# Patient Record
Sex: Female | Born: 1985 | Race: White | Hispanic: No | Marital: Single | Smoking: Light tobacco smoker
Health system: Southern US, Community
[De-identification: ages and names within clinical notes are randomized; demographics above are authoritative.]

---

## 2018-12-08 ENCOUNTER — Other Ambulatory Visit: Payer: Self-pay

## 2018-12-09 ENCOUNTER — Ambulatory Visit: Payer: Medicaid Other | Admitting: Family Medicine

## 2018-12-09 ENCOUNTER — Other Ambulatory Visit: Payer: Self-pay

## 2018-12-09 ENCOUNTER — Encounter: Payer: Self-pay | Admitting: Family Medicine

## 2018-12-09 VITALS — BP 113/73 | HR 92 | Temp 97.8°F | Ht 69.0 in | Wt 189.2 lb

## 2018-12-09 DIAGNOSIS — Z0000 Encounter for general adult medical examination without abnormal findings: Secondary | ICD-10-CM

## 2018-12-09 DIAGNOSIS — N644 Mastodynia: Secondary | ICD-10-CM

## 2018-12-09 DIAGNOSIS — Z0001 Encounter for general adult medical examination with abnormal findings: Secondary | ICD-10-CM

## 2018-12-09 DIAGNOSIS — Z23 Encounter for immunization: Secondary | ICD-10-CM | POA: Diagnosis not present

## 2018-12-09 NOTE — Patient Instructions (Signed)
Preventive Care 21-35 Years Old, Female Preventive care refers to visits with your health care provider and lifestyle choices that can promote health and wellness. This includes:  A yearly physical exam. This may also be called an annual well check.  Regular dental visits and eye exams.  Immunizations.  Screening for certain conditions.  Healthy lifestyle choices, such as eating a healthy diet, getting regular exercise, not using drugs or products that contain nicotine and tobacco, and limiting alcohol use. What can I expect for my preventive care visit? Physical exam Your health care provider will check your:  Height and weight. This may be used to calculate body mass index (BMI), which tells if you are at a healthy weight.  Heart rate and blood pressure.  Skin for abnormal spots. Counseling Your health care provider may ask you questions about your:  Alcohol, tobacco, and drug use.  Emotional well-being.  Home and relationship well-being.  Sexual activity.  Eating habits.  Work and work environment.  Method of birth control.  Menstrual cycle.  Pregnancy history. What immunizations do I need?  Influenza (flu) vaccine  This is recommended every year. Tetanus, diphtheria, and pertussis (Tdap) vaccine  You may need a Td booster every 10 years. Varicella (chickenpox) vaccine  You may need this if you have not been vaccinated. Human papillomavirus (HPV) vaccine  If recommended by your health care provider, you may need three doses over 6 months. Measles, mumps, and rubella (MMR) vaccine  You may need at least one dose of MMR. You may also need a second dose. Meningococcal conjugate (MenACWY) vaccine  One dose is recommended if you are age 19-21 years and a first-year college student living in a residence hall, or if you have one of several medical conditions. You may also need additional booster doses. Pneumococcal conjugate (PCV13) vaccine  You may need  this if you have certain conditions and were not previously vaccinated. Pneumococcal polysaccharide (PPSV23) vaccine  You may need one or two doses if you smoke cigarettes or if you have certain conditions. Hepatitis A vaccine  You may need this if you have certain conditions or if you travel or work in places where you may be exposed to hepatitis A. Hepatitis B vaccine  You may need this if you have certain conditions or if you travel or work in places where you may be exposed to hepatitis B. Haemophilus influenzae type b (Hib) vaccine  You may need this if you have certain conditions. You may receive vaccines as individual doses or as more than one vaccine together in one shot (combination vaccines). Talk with your health care provider about the risks and benefits of combination vaccines. What tests do I need?  Blood tests  Lipid and cholesterol levels. These may be checked every 5 years starting at age 20.  Hepatitis C test.  Hepatitis B test. Screening  Diabetes screening. This is done by checking your blood sugar (glucose) after you have not eaten for a while (fasting).  Sexually transmitted disease (STD) testing.  BRCA-related cancer screening. This may be done if you have a family history of breast, ovarian, tubal, or peritoneal cancers.  Pelvic exam and Pap test. This may be done every 3 years starting at age 21. Starting at age 30, this may be done every 5 years if you have a Pap test in combination with an HPV test. Talk with your health care provider about your test results, treatment options, and if necessary, the need for more tests.   Follow these instructions at home: Eating and drinking   Eat a diet that includes fresh fruits and vegetables, whole grains, lean protein, and low-fat dairy.  Take vitamin and mineral supplements as recommended by your health care provider.  Do not drink alcohol if: ? Your health care provider tells you not to drink. ? You are  pregnant, may be pregnant, or are planning to become pregnant.  If you drink alcohol: ? Limit how much you have to 0-1 drink a day. ? Be aware of how much alcohol is in your drink. In the [place] one drink equals one 12 oz bottle of beer (355 mL), one 5 oz glass of wine (148 mL), or one 1 oz glass of hard liquor (44 mL). Lifestyle  Take daily care of your teeth and gums.  Stay active. Exercise for at least 30 minutes on 5 or more days each week.  Do not use any products that contain nicotine or tobacco, such as cigarettes, e-cigarettes, and chewing tobacco. If you need help quitting, ask your health care provider.  If you are sexually active, practice safe sex. Use a condom or other form of birth control (contraception) in order to prevent pregnancy and STIs (sexually transmitted infections). If you plan to become pregnant, see your health care provider for a preconception visit. What's next?  Visit your health care provider once a year for a well check visit.  Ask your health care provider how often you should have your eyes and teeth checked.  Stay up to date on all vaccines. This information is not intended to replace advice given to you by your health care provider. Make sure you discuss any questions you have with your health care provider. Document Released: 03/06/2001 Document Revised: 09/19/2017 Document Reviewed: 09/19/2017 Elsevier Patient Education  2020 [employer]  

## 2018-12-09 NOTE — Progress Notes (Signed)
New Patient Office Visit  Assessment & Plan:  1. Well adult exam - Preventive care education provided. Pap smear record requested which patient reported was 2 years ago. UTD with TDAP. Declined HIV screening. Flu given today.  - CBC with Differential/Platelet - CMP14+EGFR - Lipid Panel  2. Pain of left breast - MM Digital Diagnostic Unilat L; Future - ordered with ultrasound if needed.  3. Need for immunization against influenza - Flu Vaccine QUAD 36+ mos IM   Follow-up: Return in about 1 year (around 12/09/2019) for annual physical.   Hendricks Limes, MSN, APRN, FNP-C [facility]  Subjective:  Patient ID: Latasha Horton, female    DOB: September 14, 1985  Age: 33 y.o. MRN: [id number]  Patient Care Team: Loman Brooklyn, FNP as PCP - General (Family Medicine)  CC:  Chief Complaint  Patient presents with  . New Patient (Initial Visit)  . Establish Care  . Breast Pain    x 2 weeks- left    HPI Latasha Horton presents to establish care. She does not have a former PCP from which she is transferring care. She was going to the health department in [facility] for pap smears.  Patient reports a lump in her left breast that she noticed ~3 weeks ago. It was tender for two weeks and then started feeling better last week. She does not drink excessive amounts of caffeine but does have one cup of coffee per day. She has an IUD and is therefore not having periods. She has never had a mammogram. No family history of breast cancer. She has lost a lot of weight over the past couple of years being on the keto and then low carb diet. She has lost from 277 lbs down to 189 lbs today (88 lbs). She wonders if she can just feel more since her breasts are no longer as big. She denies any nipple drainage.    Review of Systems  Constitutional: Negative for chills, fever, malaise/fatigue and weight loss.  HENT: Negative for congestion, ear discharge, ear pain, nosebleeds, sinus pain,  sore throat and tinnitus.   Eyes: Negative for blurred vision, double vision, pain, discharge and redness.  Respiratory: Negative for cough, shortness of breath and wheezing.   Cardiovascular: Negative for chest pain, palpitations and leg swelling.  Gastrointestinal: Negative for abdominal pain, constipation, diarrhea, heartburn, nausea and vomiting.  Genitourinary: Negative for dysuria, frequency and urgency.  Musculoskeletal: Negative for myalgias.  Skin: Negative for rash.  Neurological: Negative for dizziness, seizures, weakness and headaches.  Psychiatric/Behavioral: Negative for depression, substance abuse and suicidal ideas. The patient is not nervous/anxious.    No current outpatient medications on file.  No Known Allergies  History reviewed. No pertinent past medical history.  Past Surgical History:  Procedure Laterality Date  . CESAREAN SECTION      Family History  Problem Relation Age of Onset  . Diabetes Father     Social History   Socioeconomic History  . Marital status: Single    Spouse name: Not on file  . Number of children: Not on file  . Years of education: Not on file  . Highest education level: Not on file  Occupational History  . Not on file  Social Needs  . Financial resource strain: Not on file  . Food insecurity    Worry: Not on file    Inability: Not on file  . Transportation needs    Medical: Not on file    Non-medical: Not on  file  Tobacco Use  . Smoking status: Light Tobacco Smoker    Types: Cigarettes  . Smokeless tobacco: Never Used  Substance and Sexual Activity  . Alcohol use: Yes    Comment: occ  . Drug use: Never  . Sexual activity: Not on file  Lifestyle  . Physical activity    Days per week: Not on file    Minutes per session: Not on file  . Stress: Not on file  Relationships  . Social Herbalist on phone: Not on file    Gets together: Not on file    Attends religious service: Not on file    Active member of  club or organization: Not on file    Attends meetings of clubs or organizations: Not on file    Relationship status: Not on file  . Intimate partner violence    Fear of current or ex partner: Not on file    Emotionally abused: Not on file    Physically abused: Not on file    Forced sexual activity: Not on file  Other Topics Concern  . Not on file  Social History Narrative  . Not on file    Objective:   Today's Vitals: BP 113/73   Pulse 92   Temp 97.8 F (36.6 C) (Temporal)   Ht '5\' 9"'  (1.753 m)   Wt 189 lb 3.2 oz (85.8 kg)   LMP 12/02/2018   SpO2 100%   BMI 27.94 kg/m   Physical Exam Vitals signs reviewed. Exam conducted with a chaperone present.  Constitutional:      General: She is not in acute distress.    Appearance: Normal appearance. She is not ill-appearing, toxic-appearing or diaphoretic.  HENT:     Head: Normocephalic and atraumatic.     Right Ear: Tympanic membrane, ear canal and external ear normal. There is no impacted cerumen.     Left Ear: Tympanic membrane, ear canal and external ear normal. There is no impacted cerumen.     Nose: Nose normal. No congestion or rhinorrhea.     Mouth/Throat:     Mouth: Mucous membranes are moist.     Pharynx: Oropharynx is clear. No oropharyngeal exudate or posterior oropharyngeal erythema.  Eyes:     General: No scleral icterus.       Right eye: No discharge.        Left eye: No discharge.     Conjunctiva/sclera: Conjunctivae normal.     Pupils: Pupils are equal, round, and reactive to light.  Neck:     Musculoskeletal: Normal range of motion and neck supple. No neck rigidity or muscular tenderness.  Cardiovascular:     Rate and Rhythm: Normal rate and regular rhythm.     Heart sounds: Normal heart sounds. No murmur. No friction rub. No gallop.   Pulmonary:     Effort: Pulmonary effort is normal. No respiratory distress.     Breath sounds: Normal breath sounds. No stridor. No wheezing, rhonchi or rales.  Chest:      Breasts:        Right: No swelling, bleeding, inverted nipple, mass, nipple discharge, skin change or tenderness.        Left: Tenderness (under left nipple) present. No swelling, bleeding, inverted nipple, mass, nipple discharge or skin change.  Abdominal:     General: Abdomen is flat.  Musculoskeletal: Normal range of motion.  Lymphadenopathy:     Cervical: No cervical adenopathy.     Upper Body:  Right upper body: No supraclavicular, axillary or pectoral adenopathy.     Left upper body: No supraclavicular, axillary or pectoral adenopathy.  Skin:    General: Skin is warm and dry.     Capillary Refill: Capillary refill takes less than 2 seconds.  Neurological:     General: No focal deficit present.     Mental Status: She is alert and oriented to person, place, and time. Mental status is at baseline.  Psychiatric:        Mood and Affect: Mood normal.        Behavior: Behavior normal.        Thought Content: Thought content normal.        Judgment: Judgment normal.

## 2018-12-10 LAB — CBC WITH DIFFERENTIAL/PLATELET
Basophils Absolute: 0 10*3/uL (ref 0.0–0.2)
Basos: 0 %
EOS (ABSOLUTE): 0.2 10*3/uL (ref 0.0–0.4)
Eos: 2 %
Hematocrit: 36.7 % (ref 34.0–46.6)
Hemoglobin: 12.3 g/dL (ref 11.1–15.9)
Immature Grans (Abs): 0 10*3/uL (ref 0.0–0.1)
Immature Granulocytes: 0 %
Lymphocytes Absolute: 2.4 10*3/uL (ref 0.7–3.1)
Lymphs: 34 %
MCH: 31.8 pg (ref 26.6–33.0)
MCHC: 33.5 g/dL (ref 31.5–35.7)
MCV: 95 fL (ref 79–97)
Monocytes Absolute: 0.6 10*3/uL (ref 0.1–0.9)
Monocytes: 9 %
Neutrophils Absolute: 3.8 10*3/uL (ref 1.4–7.0)
Neutrophils: 55 %
Platelets: 147 10*3/uL — ABNORMAL LOW (ref 150–450)
RBC: 3.87 x10E6/uL (ref 3.77–5.28)
RDW: 12.4 % (ref 11.7–15.4)
WBC: 7 10*3/uL (ref 3.4–10.8)

## 2018-12-10 LAB — CMP14+EGFR
ALT: 18 IU/L (ref 0–32)
AST: 20 IU/L (ref 0–40)
Albumin/Globulin Ratio: 1.7 (ref 1.2–2.2)
Albumin: 4.4 g/dL (ref 3.8–4.8)
Alkaline Phosphatase: 35 IU/L — ABNORMAL LOW (ref 39–117)
BUN/Creatinine Ratio: 10 (ref 9–23)
BUN: 6 mg/dL (ref 6–20)
Bilirubin Total: 0.4 mg/dL (ref 0.0–1.2)
CO2: 23 mmol/L (ref 20–29)
Calcium: 9.1 mg/dL (ref 8.7–10.2)
Chloride: 101 mmol/L (ref 96–106)
Creatinine, Ser: 0.63 mg/dL (ref 0.57–1.00)
GFR calc Af Amer: 134 mL/min/{1.73_m2} (ref >59)
GFR calc non Af Amer: 117 mL/min/{1.73_m2} (ref >59)
Globulin, Total: 2.6 g/dL (ref 1.5–4.5)
Glucose: 79 mg/dL (ref 65–99)
Potassium: 3.9 mmol/L (ref 3.5–5.2)
Sodium: 137 mmol/L (ref 134–144)
Total Protein: 7 g/dL (ref 6.0–8.5)

## 2018-12-10 LAB — LIPID PANEL
Chol/HDL Ratio: 2.8 ratio (ref 0.0–4.4)
Cholesterol, Total: 161 mg/dL (ref 100–199)
HDL: 58 mg/dL (ref >39)
LDL Chol Calc (NIH): 89 mg/dL (ref 0–99)
Triglycerides: 74 mg/dL (ref 0–149)
VLDL Cholesterol Cal: 14 mg/dL (ref 5–40)

## 2018-12-11 ENCOUNTER — Encounter: Payer: Self-pay | Admitting: Family Medicine

## 2019-02-03 ENCOUNTER — Telehealth: Payer: Self-pay

## 2019-02-03 ENCOUNTER — Other Ambulatory Visit: Payer: Self-pay

## 2019-02-03 DIAGNOSIS — N644 Mastodynia: Secondary | ICD-10-CM

## 2019-02-03 NOTE — Telephone Encounter (Signed)
Called patient in regards to appt for breast imaging  - LMTCB

## 2019-02-04 ENCOUNTER — Other Ambulatory Visit: Payer: Self-pay | Admitting: Family Medicine

## 2019-02-04 ENCOUNTER — Telehealth: Payer: Self-pay | Admitting: Family Medicine

## 2019-02-04 DIAGNOSIS — N644 Mastodynia: Secondary | ICD-10-CM

## 2019-02-05 NOTE — Telephone Encounter (Signed)
Called patient and notified her of appointment for breast imaging

## 2019-02-17 ENCOUNTER — Other Ambulatory Visit: Payer: Self-pay

## 2019-02-17 ENCOUNTER — Ambulatory Visit (HOSPITAL_COMMUNITY)
Admission: RE | Admit: 2019-02-17 | Discharge: 2019-02-17 | Disposition: A | Discharge status: Home / Self Care | Payer: BLUE CROSS/BLUE SHIELD | Source: Ambulatory Visit | Attending: Family Medicine | Admitting: Family Medicine

## 2019-02-17 ENCOUNTER — Ambulatory Visit (HOSPITAL_COMMUNITY): Payer: BLUE CROSS/BLUE SHIELD

## 2019-02-17 ENCOUNTER — Inpatient Hospital Stay (HOSPITAL_COMMUNITY)
Admission: RE | Admit: 2019-02-17 | Discharge: 2019-02-17 | Disposition: A | Discharge status: Home / Self Care | Payer: BLUE CROSS/BLUE SHIELD | Source: Ambulatory Visit | Attending: Family Medicine | Admitting: Family Medicine

## 2019-02-17 DIAGNOSIS — N644 Mastodynia: Secondary | ICD-10-CM

## 2020-02-02 IMAGING — US US BREAST*L* LIMITED INC AXILLA
1 series · 8 of 8 positions shown · non-contrast
Comparison: None.

CLINICAL DATA: 33-year-old female with a palpable area of concern
in the left breast which the patient feels may be smaller. The
patient has had an intentional 100 pound weight loss over the past 2
years.

EXAM:
DIGITAL DIAGNOSTIC BILATERAL MAMMOGRAM WITH CAD AND TOMO
LEFT BREAST ULTRASOUND

[Series 1: us breast*left* limited inc axilla · 0.07mm/px · 8 of 8 slices shown]
[im 1/8]
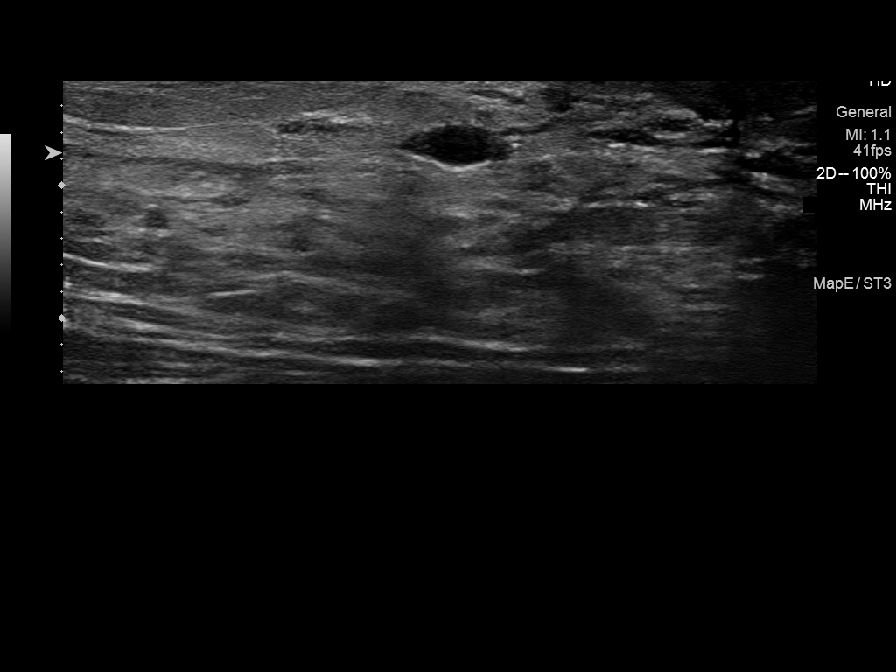
[im 2/8]
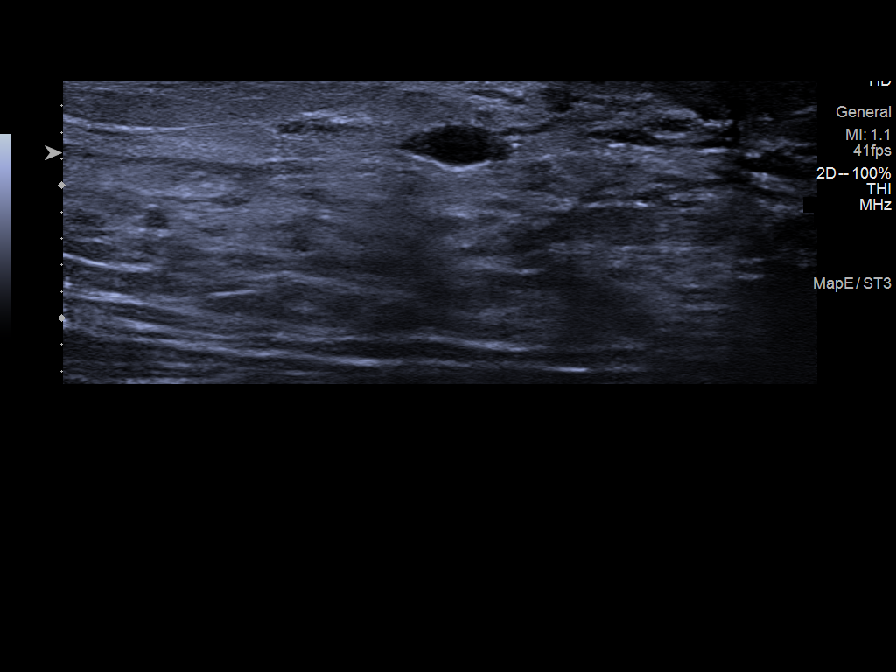
[im 3/8]
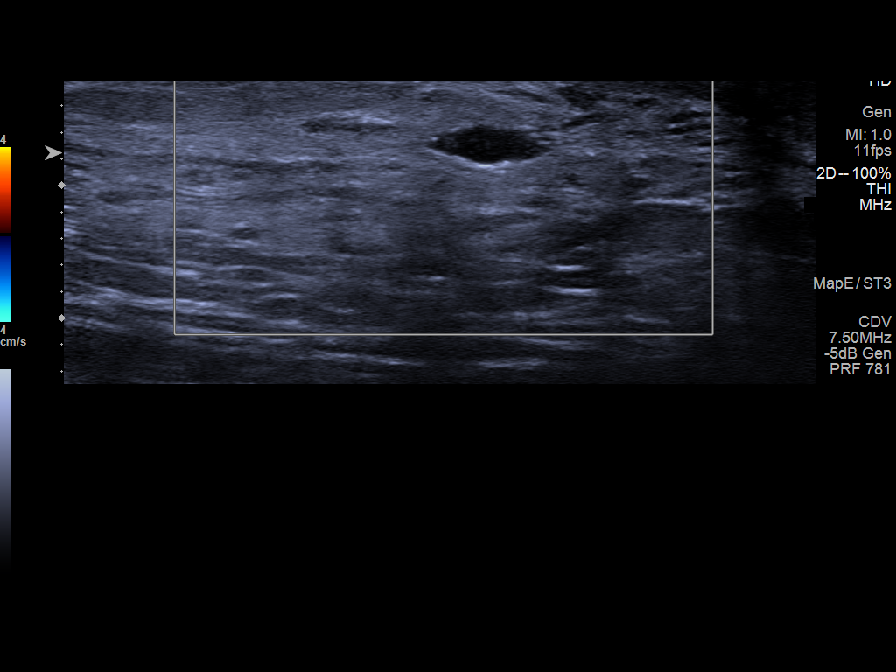
[im 4/8]
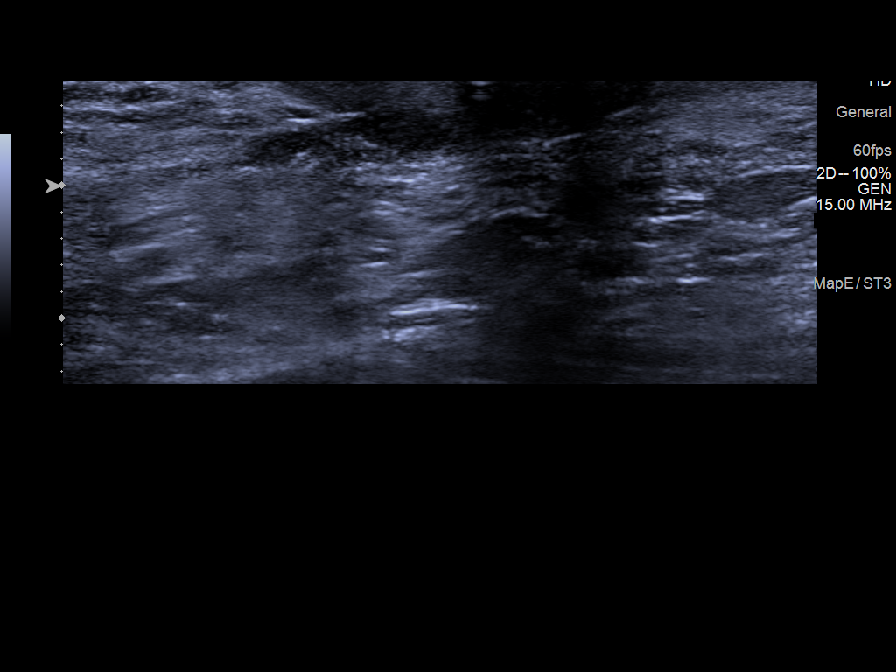
[im 5/8]
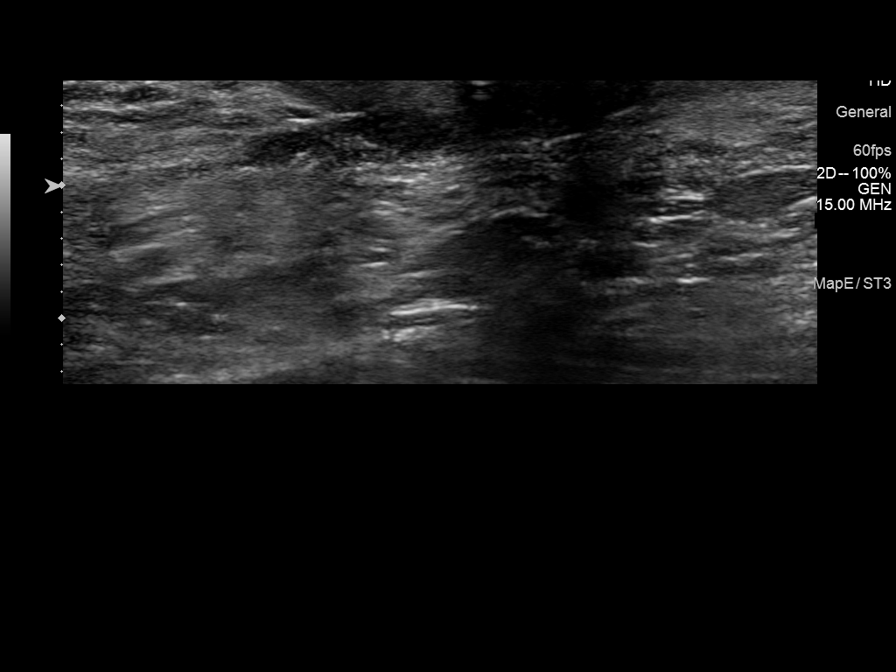
[im 6/8]
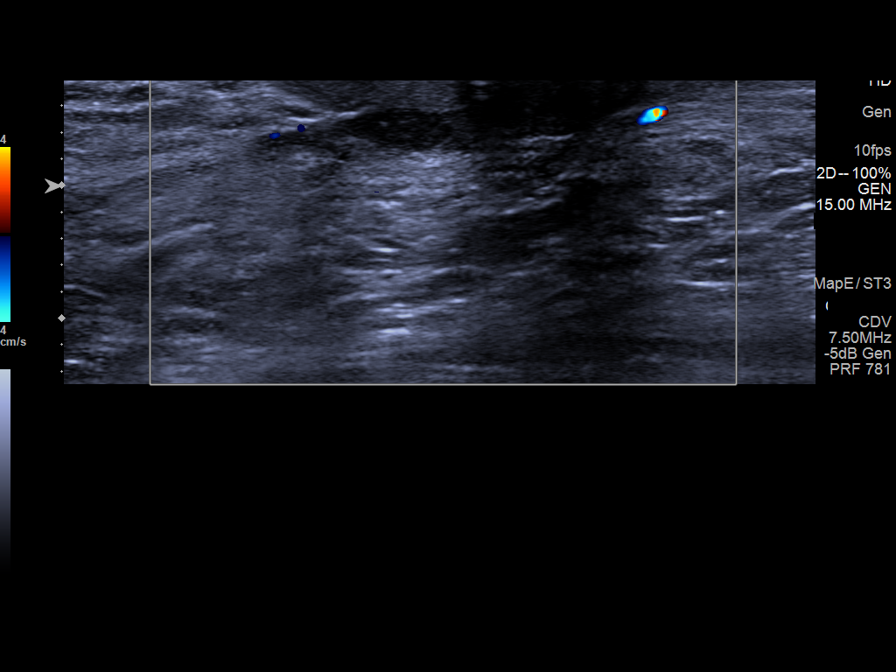
[im 7/8]
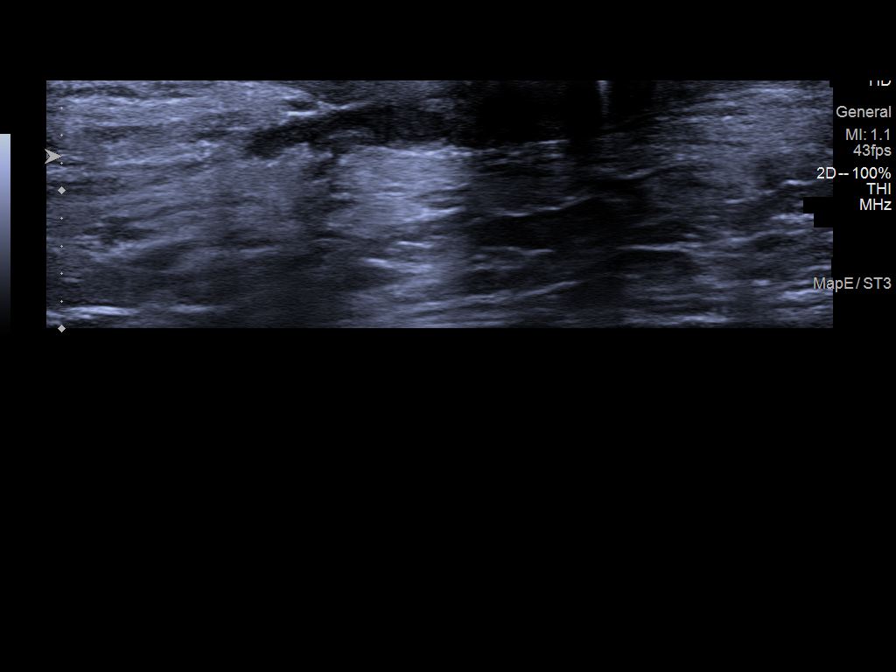
[im 8/8]
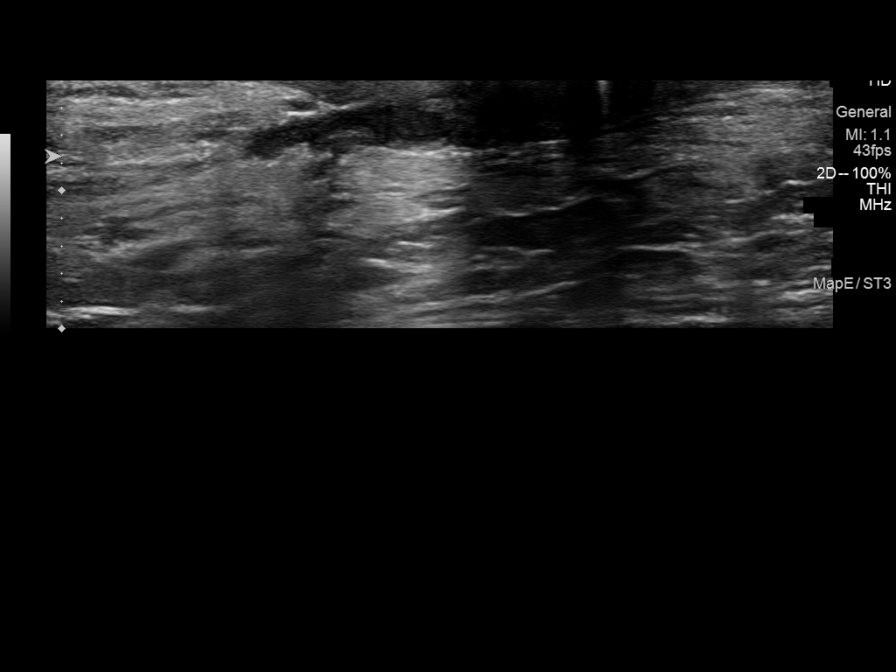

[8 of 8 positions shown; findings below may reference images not displayed]

ACR Breast Density Category c: The breast tissue is heterogeneously
dense, which may obscure small masses.
FINDINGS: No suspicious masses or calcifications are seen in the right breast.
Spot compression cc tomograms were performed over the palpable area
of concern in the slightly inner left breast with only dense
fibroglandular tissue seen.

Mammographic images were processed with CAD.

Physical examination of the periareolar/slightly upper inner left
breast in the region of palpable concern reveals an area of mobile
nodularity.

Targeted ultrasound of the left breast was performed. No suspicious
masses or abnormality seen, only scattered areas of fibrocystic
change with several benign-appearing slightly dilated ducts in the
region of concern. These slightly dilated ducts are felt to
correspond well with the patient's palpable abnormality. No
suspicious masses or abnormality seen.
IMPRESSION: 1.  No findings of malignancy in either breast.

2. No suspicious mammographic or sonographic findings seen in the
region of palpable concern in the left breast, with only mild
benign-appearing ductal ectasia identified.

RECOMMENDATION:
1. Recommend further management of the left breast palpable area of
concern be based on clinical assessment.

2. Screening mammogram at age 40 unless there are persistent or
intervening clinical concerns. (Code:H6-S-IFJ)

I have discussed the findings and recommendations with the patient.
If applicable, a reminder letter will be sent to the patient
regarding the next appointment.

BI-RADS CATEGORY  2: Benign.
